# Patient Record
Sex: Male | Born: 1985 | Race: White | Hispanic: Yes | Smoking: Current every day smoker
Health system: Southern US, Community
[De-identification: ages and names within clinical notes are randomized; demographics above are authoritative.]

---

## 2010-01-21 ENCOUNTER — Emergency Department (HOSPITAL_COMMUNITY): Admission: EM | Admit: 2010-01-21 | Discharge: 2010-01-22 | Payer: Self-pay | Admitting: Emergency Medicine

## 2011-03-01 LAB — URINALYSIS, ROUTINE W REFLEX MICROSCOPIC
Bilirubin Urine: NEGATIVE
Glucose, UA: NEGATIVE mg/dL
Hgb urine dipstick: NEGATIVE
Ketones, ur: NEGATIVE mg/dL
pH: 6 (ref 5.0–8.0)

## 2011-03-01 LAB — URINE CULTURE

## 2016-02-27 ENCOUNTER — Other Ambulatory Visit: Payer: Self-pay

## 2016-02-27 ENCOUNTER — Encounter (HOSPITAL_COMMUNITY): Payer: Self-pay

## 2016-02-27 ENCOUNTER — Emergency Department (HOSPITAL_COMMUNITY): Payer: Self-pay

## 2016-02-27 ENCOUNTER — Emergency Department (HOSPITAL_COMMUNITY)
Admission: EM | Admit: 2016-02-27 | Discharge: 2016-02-27 | Disposition: A | Discharge status: Home / Self Care | Payer: Self-pay | Attending: Emergency Medicine | Admitting: Emergency Medicine

## 2016-02-27 DIAGNOSIS — M79605 Pain in left leg: Secondary | ICD-10-CM | POA: Insufficient documentation

## 2016-02-27 DIAGNOSIS — F17210 Nicotine dependence, cigarettes, uncomplicated: Secondary | ICD-10-CM | POA: Insufficient documentation

## 2016-02-27 DIAGNOSIS — I8392 Asymptomatic varicose veins of left lower extremity: Secondary | ICD-10-CM | POA: Insufficient documentation

## 2016-02-27 LAB — CBC WITH DIFFERENTIAL/PLATELET
BASOS ABS: 0.1 10*3/uL (ref 0.0–0.1)
BASOS PCT: 1 %
EOS ABS: 0.2 10*3/uL (ref 0.0–0.7)
Eosinophils Relative: 2 %
HCT: 46.2 % (ref 39.0–52.0)
HEMOGLOBIN: 16.1 g/dL (ref 13.0–17.0)
Lymphocytes Relative: 36 %
Lymphs Abs: 3.7 10*3/uL (ref 0.7–4.0)
MCH: 30 pg (ref 26.0–34.0)
MCHC: 34.8 g/dL (ref 30.0–36.0)
MCV: 86 fL (ref 78.0–100.0)
MONOS PCT: 5 %
Monocytes Absolute: 0.5 10*3/uL (ref 0.1–1.0)
NEUTROS PCT: 56 %
Neutro Abs: 5.8 10*3/uL (ref 1.7–7.7)
Platelets: 209 10*3/uL (ref 150–400)
RBC: 5.37 MIL/uL (ref 4.22–5.81)
RDW: 13 % (ref 11.5–15.5)
WBC: 10.3 10*3/uL (ref 4.0–10.5)

## 2016-02-27 LAB — BASIC METABOLIC PANEL
Anion gap: 11 (ref 5–15)
BUN: 11 mg/dL (ref 6–20)
CHLORIDE: 102 mmol/L (ref 101–111)
CO2: 28 mmol/L (ref 22–32)
CREATININE: 0.78 mg/dL (ref 0.61–1.24)
Calcium: 9.6 mg/dL (ref 8.9–10.3)
GFR calc non Af Amer: >60 mL/min (ref >60)
Glucose, Bld: 94 mg/dL (ref 65–99)
Potassium: 4 mmol/L (ref 3.5–5.1)
SODIUM: 141 mmol/L (ref 135–145)

## 2016-02-27 LAB — D-DIMER, QUANTITATIVE (NOT AT ARMC)

## 2016-02-27 LAB — CK: CK TOTAL: 78 U/L (ref 49–397)

## 2016-02-27 MED ORDER — IBUPROFEN 400 MG PO TABS
600.0000 mg | ORAL_TABLET | Freq: Once | ORAL | Status: AC
  Administered 2016-02-27: 600 mg via ORAL
  Filled 2016-02-27: qty 1

## 2016-02-27 NOTE — ED Notes (Signed)
Pt ambulates independently and with steady gait at time of discharge. Discharge instructions and follow up information reviewed with patient. No other questions or concerns voiced at this time.  

## 2016-02-27 NOTE — Discharge Instructions (Signed)
We were able to rule out blood clot in your legs with blood work. Please continue tylenol and motrin as needed for pain control. Keep legs elevated at rest to decrease swelling.   Return for worsening symptoms, including worsening pain, worsening swelling, fevers, difficulty breathing, chest pain, or any other symptoms concerning to you.  Venas varicosas (Varicose Veins) Las venas varicosas son venas que se han agrandado y tornado sinuosas. Suelen aparecer [employer] piernas, pero tambin pueden verse en otra parte del cuerpo. CAUSAS Esta afeccin se presenta como consecuencia del mal funcionamiento de las vlvulas de las venas, las cuales ayudan al retorno de la sangre desde las piernas hacia el corazn. Si estas vlvulas se daan, la sangre retrocede y regresa a las venas de la pierna, cerca de la superficie de la piel, lo que causa dilatacin venosa. FACTORES DE RIESGO Las personas que estn mucho tiempo paradas, las embarazadas o las personas con sobrepeso tienen ms probabilidades de tener venas varicosas. SIGNOS Y SNTOMAS  Venas abultadas, azuladas y de aspecto sinuoso que se observan con mayor frecuencia en las piernas.  Dolor o sensacin de Development worker, community las piernas. Estos sntomas pueden empeorar al final del da.  Hinchazn de las piernas.  Cambios en el color de la piel. DIAGNSTICO Generalmente, el mdico puede diagnosticar las venas varicosas al examinarle las piernas. Adems, puede recomendarle que se haga una ecografa de las venas de las piernas. TRATAMIENTO La mayora de las venas varicosas pueden tratarse en casa. Sin embargo, hay otros tratamientos a disposicin de [employer] tienen sntomas persistentes o desean mejorar la apariencia esttica de las venas varicosas. Estas opciones de tratamiento incluyen lo siguiente:  Escleroterapia. Se inyecta una solucin en la vena para anularla.  Tratamiento con lser. Se [place] un rayo lser para calentar la vena y  anularla.  Ablacin venosa por radiofrecuencia Se [place] una corriente elctrica que se produce mediante ondas de radio para anular la vena.  Flebectoma. Se extirpa quirrgicamente la vena a travs de pequeas incisiones que se hacen sobre la vena varicosa.  Ligadura venosa y varicectoma. La vena se extirpa quirrgicamente a travs de incisiones que se realizan sobre la vena varicosa despus de haberla anudado (ligado). INSTRUCCIONES PARA EL CUIDADO EN EL HOGAR  No permanezca sentado o de pie en una posicin durante mucho tiempo. No se siente con las piernas cruzadas. Descanse con las piernas Radiation protection practitioner.  Use medias de compresin como le haya indicado su mdico. Estas medias ayudan a evitar la formacin de cogulos sanguneos y a Building services engineer de las piernas.  No use otras prendas que le ajusten todo el contorno de las piernas, la pelvis o la cintura.  Camine todo lo posible para aumentar la circulacin de Risk manager.  A la noche, eleve el pie de la cama con bloques de 2pulgadas.  Si tiene un corte en la piel sobre la vena y la vena sangra, recustese con la pierna elevada y [place] presin en el lugar con un pao limpio, hasta que deje de Geophysicist/field seismologist. Luego aplique un apsito (vendaje) sobre el corte. Consulte al mdico si el sangrado contina. SOLICITE ATENCIN MDICA SI:  La piel alrededor del tobillo empieza a Lobbyist.  Siente dolor, hay enrojecimiento, sensibilidad o hinchazn dura en la pierna sobre una vena.  Est incmodo debido al dolor de la pierna.   Esta informacin no tiene Theme park manager el consejo del mdico. Asegrese de hacerle al mdico cualquier pregunta que tenga.  Document Released: 09/05/2005 Document Revised: 12/17/2014 Elsevier Interactive Patient Education 2016 [employer]  [place] Parkiruga de venas varicosas (Varicose Vein Surgery) La ciruga de venas varicosas es un procedimiento que se realiza para extirpar o reparar las venas varicosas.  Se trata de venas que estn hinchadas y retorcidas, y que son visibles debajo de la piel, especialmente en las piernas. Estas venas pueden verse azules y abultadas. Todas las venas tienen una vlvula que mantiene la sangre circulando en una nica direccin. Si estas vlvulas se debilitan o se daan, la sangre puede acumularse y causar venas varicosas. Pueden someterlo a [place]una ciruga de venas varicosas si estas venas estn causando sntomas o complicaciones, o si los Murphy Oilcambios en el estilo de vida no han sido de [place]ayuda. La ciruga puede Engineer, materialsaliviar el dolor y las Lighthouse Pointmolestias, y disminuir el riesgo de hemorragias y cogulos sanguneos. La ciruga tambin puede mejorar el aspecto de la zona afectada (aspecto esttico). Los diferentes tipos de [place]iruga de venas varicosas incluyen lo siguiente:  Tourist information centre managernyectar una sustancia qumica para ocluir una vena (escleroterapia).  Tratar Neomia Dearuna vena con energa lumnica (tratamiento con lser).  Usar rayos lser u ondas de sonido para ocluir una vena con calor (ablacin venosa por radiofrecuencia).  Extirpar quirrgicamente la vena a travs de una pequea incisin (flebectoma).  Extirpar quirrgicamente la vena a travs de incisiones despus de su ligadura (extirpacin venosa con ligadura). El mdico analizar el mtodo ms adecuado para usted en funcin de su afeccin. INFORME A SU MDICO:  Cualquier alergia que tenga.  Todos los Walt Disneymedicamentos que utiliza, incluidos vitaminas, hierbas, gotas oftlmicas, cremas y [address]medicamentos de [address] Northventa libre.  Problemas previos que usted o los Graybar Electricmiembros de su familia hayan tenido con el uso de anestsicos.  Enfermedades de la sangre que tenga.  Si tiene cirugas previas.  Enfermedades que tenga. RIESGOS Y COMPLICACIONES En general, se trata de un procedimiento seguro. Sin embargo, pueden ocurrir American International Groupalgunos problemas, que Baxter Internationalincluyen los siguientes:  Dao a las venas, los nervios o los tejidos cercanos.  Llagas.  Manchas  negras.  Irritacin de la piel.  Entumecimiento.  Formacin de cogulos.  Infeccin.  Formacin de cicatrices.  Hinchazn de las piernas.  Necesidad de someterse a ms tratamientos. ANTES DEL PROCEDIMIENTO   Consulte a su mdico acerca de estos temas:  Cambiar o suspender los medicamentos que toma habitualmente. Esto es muy importante si toma medicamentos para la diabetes o anticoagulantes.  Tomar medicamentos, como aspirina e ibuprofeno. Estos medicamentos pueden tener un efecto anticoagulante en la [place]angre. No tome estos medicamentos antes del procedimiento si el mdico le indica que no lo haga.  Pueden realizarle estudios antes de la ciruga de venas varicosas. Estos estudios pueden ser para lo siguiente:  Landscape architectDetectar la presencia de cogulos y Scientist, physiologicalcontrolar la circulacin sangunea mediante el uso de ondas de sonido (ecografa Doppler).  Observar cmo circula la sangre a travs de las venas al inyectar una sustancia de contraste que permite visualizar las venas en radiografas (angiografa). Este estudio se [place] Islandsutiliza en contadas ocasiones. PROCEDIMIENTO El procedimiento variar en funcin del tipo de Azerbaijanciruga de venas varicosas que se realice: Escleroterapia  A menudo, este procedimiento se [place]ausa para las venas pequeas a medianas.  Se inyectar en la vena una sustancia qumica (esclerosante) que irrita la pared venosa. Esto tendr como resultado la oclusin de la vena varicosa.  Pueden usarse esclerosantes en diferentes cantidades y dosis, en funcin del tamao y la ubicacin de la vena.  Tal vez deba someterse a ms de Pharmacist, communityun tratamiento.  Tratamiento con lser  Este procedimiento no requiere incisiones ni sustancias qumicas.  La energa lumnica del lser se apuntar a la vena.  El tratamiento con lser puede combinarse con Regulatory affairs officer.  Tal vez deba someterse a ms de Pharmacist, community. Ablacin de la vena por radiofrecuencia.  Le administrarn un medicamento para adormecer  la zona (anestesia local).  Se har un pequeo corte quirrgico (incisin) cerca de la vena varicosa.  Se introducir una sonda (catter) en la vena.  Se usar un lser u ondas de radio para ocluir la vena con calor a travs del extremo del catter. Flebectoma  Este procedimiento quirrgico se [place] para extirpar las venas que estn ms cerca de la piel.  Le administrarn un medicamento para adormecer la zona (anestesia local).  El cirujano realizar una pequea herida punzante cerca de la vena varicosa y luego usar un gancho diminuto para retirar la vena. Extirpacin venosa con ligadura  Este procedimiento quirrgico se [place] para tratar [employer] graves.  Para realizarlo, le administrarn un medicamento que lo har dormir (anestesia general).  El cirujano har una pequea incisin cerca de la vena de la ingle (vena safena).  Lo primero ser ligar la vena.  Luego, har unas cuantas incisiones ms a lo largo de la vena.  Se extirpar la vena.  La recuperacin total puede llevar de 1 a 4semanas. DESPUS DEL PROCEDIMIENTO   En funcin del tipo de procedimiento, tal vez pueda retomar sus actividades habituales el da posterior a su realizacin.  Quizs [employer] de compresin. Estas medias ayudan a Transport planner formacin de cogulos sanguneos y a Building services engineer de las piernas.   Esta informacin no tiene Theme park manager el consejo del mdico. Asegrese de hacerle al mdico cualquier pregunta que tenga.   Document Released: 09/16/2013 Document Revised: 12/17/2014 Elsevier Interactive Patient Education [employer]

## 2016-02-27 NOTE — ED Notes (Addendum)
Pt. Having lt. Leg pain approximately on his inner thigh area.  The area is painful to touch, red and swollen. Per Pt.  He feels he is not getting the proper circulation.   He denies any chest pain.  AT time when he is at work he feels winded and the leg pain increases.     Pt. Is deaf, Interrupter present.  No redness or swelling noted to the leg

## 2016-02-27 NOTE — ED Provider Notes (Signed)
CSN: [id number]     Arrival date & time 02/27/16  1351 History   First MD Initiated Contact with Patient 02/27/16 1910     Chief Complaint  Patient presents with  . Leg Pain     (Consider location/radiation/quality/duration/timing/severity/associated sxs/prior Treatment) HPI  History is obtained through a sign language interpreter 30 year old male who presents with left leg pain and swelling. This is been ongoing for the past several weeks, worse in the 4 days. Noted that the blood vessels in the inner aspect of the leg legs have become more distended in full, and he was concerned about blood clot. Says that he typically is on his feet for the majority of the day, and find that by sitting down and keeping her legs elevated the swelling is significantly improved. He has noted that with the swelling his leg becomes very sore, especially in his upper thigh. He did not have any trauma or injury to the leg. He denies any numbness or weakness. He has not had any overlying skin changes, fevers or chills. Denies any chest pain, but has noted that with activity over the past several days he has become intermittently a little short of breath. No chest pain, cough, orthopnea, PND.  History reviewed. No pertinent past medical history. History reviewed. No pertinent past surgical history. History reviewed. No pertinent family history. Social History  Substance Use Topics  . Smoking status: Current Every Day Smoker    Types: Cigarettes  . Smokeless tobacco: None  . Alcohol Use: Yes     Comment: occassional    Review of Systems 10/14 systems reviewed and are negative other than those stated in the HPI   Allergies  Review of patient's allergies indicates no known allergies.  Home Medications   Prior to Admission medications   Medication Sig Start Date End Date Taking? Authorizing Provider  ibuprofen (ADVIL,MOTRIN) 200 MG tablet Take 200 mg by mouth every 6 (six) hours as needed for moderate  pain.   Yes Historical Provider, MD   BP 113/87 mmHg  Pulse 70  Temp(Src) 98.5 F (36.9 C) (Oral)  Resp 15  Ht  (1.778 m)  Wt 188 lb 2 oz (85.333 kg)  BMI 26.99 kg/m2  SpO2 98% Physical Exam Physical Exam  Nursing note and vitals reviewed. Constitutional: Well developed, well nourished, non-toxic, and in no acute distress Head: Normocephalic and atraumatic.  Mouth/Throat: Oropharynx is clear and moist.  Neck: Normal range of motion. Neck supple.  Cardiovascular: Normal rate and regular rhythm.  +2 DP pulses bilaterally Pulmonary/Chest: Effort normal and breath sounds normal.  Abdominal: Soft. There is no tenderness. There is no rebound and no guarding.  Musculoskeletal: there is varicose veins along the medial aspect of the left lower extremity from the calf up to the mid thigh. No overlying skin changes Neurological: Alert, no facial droop, fluent speech, moves all extremities symmetrically Skin: Skin is warm and dry.  Psychiatric: Cooperative  ED Course  Procedures (including critical care time) Labs Review Labs Reviewed  CBC WITH DIFFERENTIAL/PLATELET  BASIC METABOLIC PANEL  CK  D-DIMER, QUANTITATIVE (NOT AT [facility])    Imaging Review Dg Chest 2 View  02/27/2016  CLINICAL DATA:  Dyspnea on exertion EXAM: CHEST  2 VIEW COMPARISON:  None. FINDINGS: The heart size and mediastinal contours are within normal limits. Both lungs are clear. The visualized skeletal structures are unremarkable. IMPRESSION: No active cardiopulmonary disease. Electronically Signed   By: Alcide Clever M.D.   On: 02/27/2016 20:47  I have personally reviewed and evaluated these images and lab results as part of my medical decision-making.   EKG Interpretation   Date/Time:  Monday February 27 2016 14:53:49 EDT Ventricular Rate:  83 PR Interval:  140 QRS Duration: 88 QT Interval:  350 QTC Calculation: 411 R Axis:   53 Text Interpretation:  Normal sinus rhythm Normal ECG No prior EKG, but   normal EKg Confirmed by Olanna Percifield MD, Annabelle HarmanANA ([id number]) on 02/27/2016 7:14:52 PM      MDM   Final diagnoses:  Varicose veins of left lower extremity  Left leg pain    30 year old male who presents with swelling and pain involving the left leg. Vital signs within normal limits on arrival. His LLE is neurovascularly in tact. No appreciable edema. There is area along medial aspect of the leg with swelling of the superficial veins c/f varicosities. No calf tenderness or calf swelling. Low risk for DVT and PE per history. D-dimer sent and negative. Ruled out for PE and DVT. No evidence of overlying skin change to suggest infection and no trauma. He is given referral for vein specialist. Will continue supportive care at home. Strict return and follow-up instructions reviewed. He expressed understanding of all discharge instructions and felt comfortable with the plan of care.  Work-up, results, plan of care, and discharge instructions all discussed via sign language interpreter.    Lavera Guiseana Duo Roch Quach, MD 02/27/16 2230

## 2017-02-08 IMAGING — DX DG CHEST 2V
2 series · 2 of 2 positions shown · non-contrast
Comparison: None.

CLINICAL DATA: Dyspnea on exertion

EXAM:
CHEST  2 VIEW

[w chest pa]
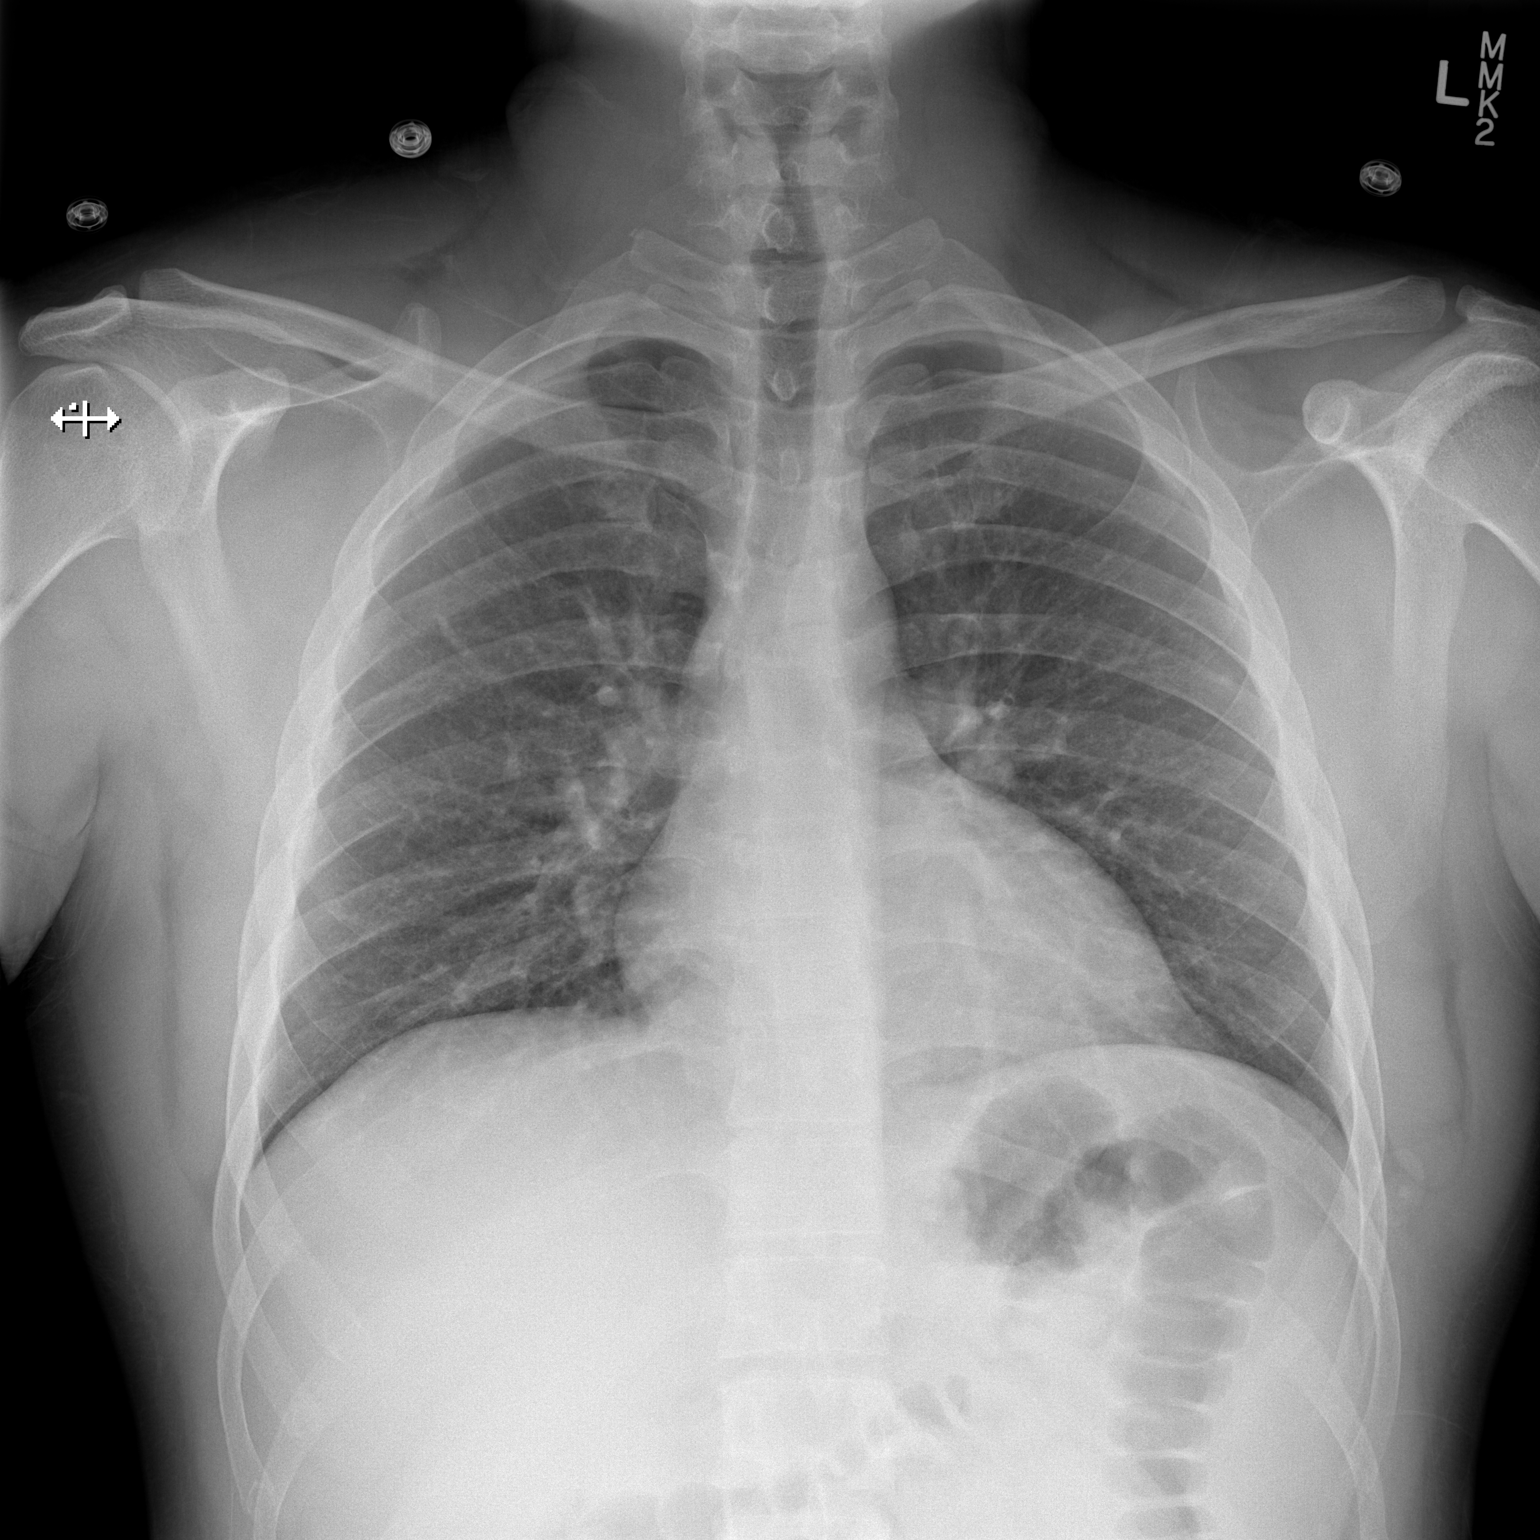

[w chest lat]
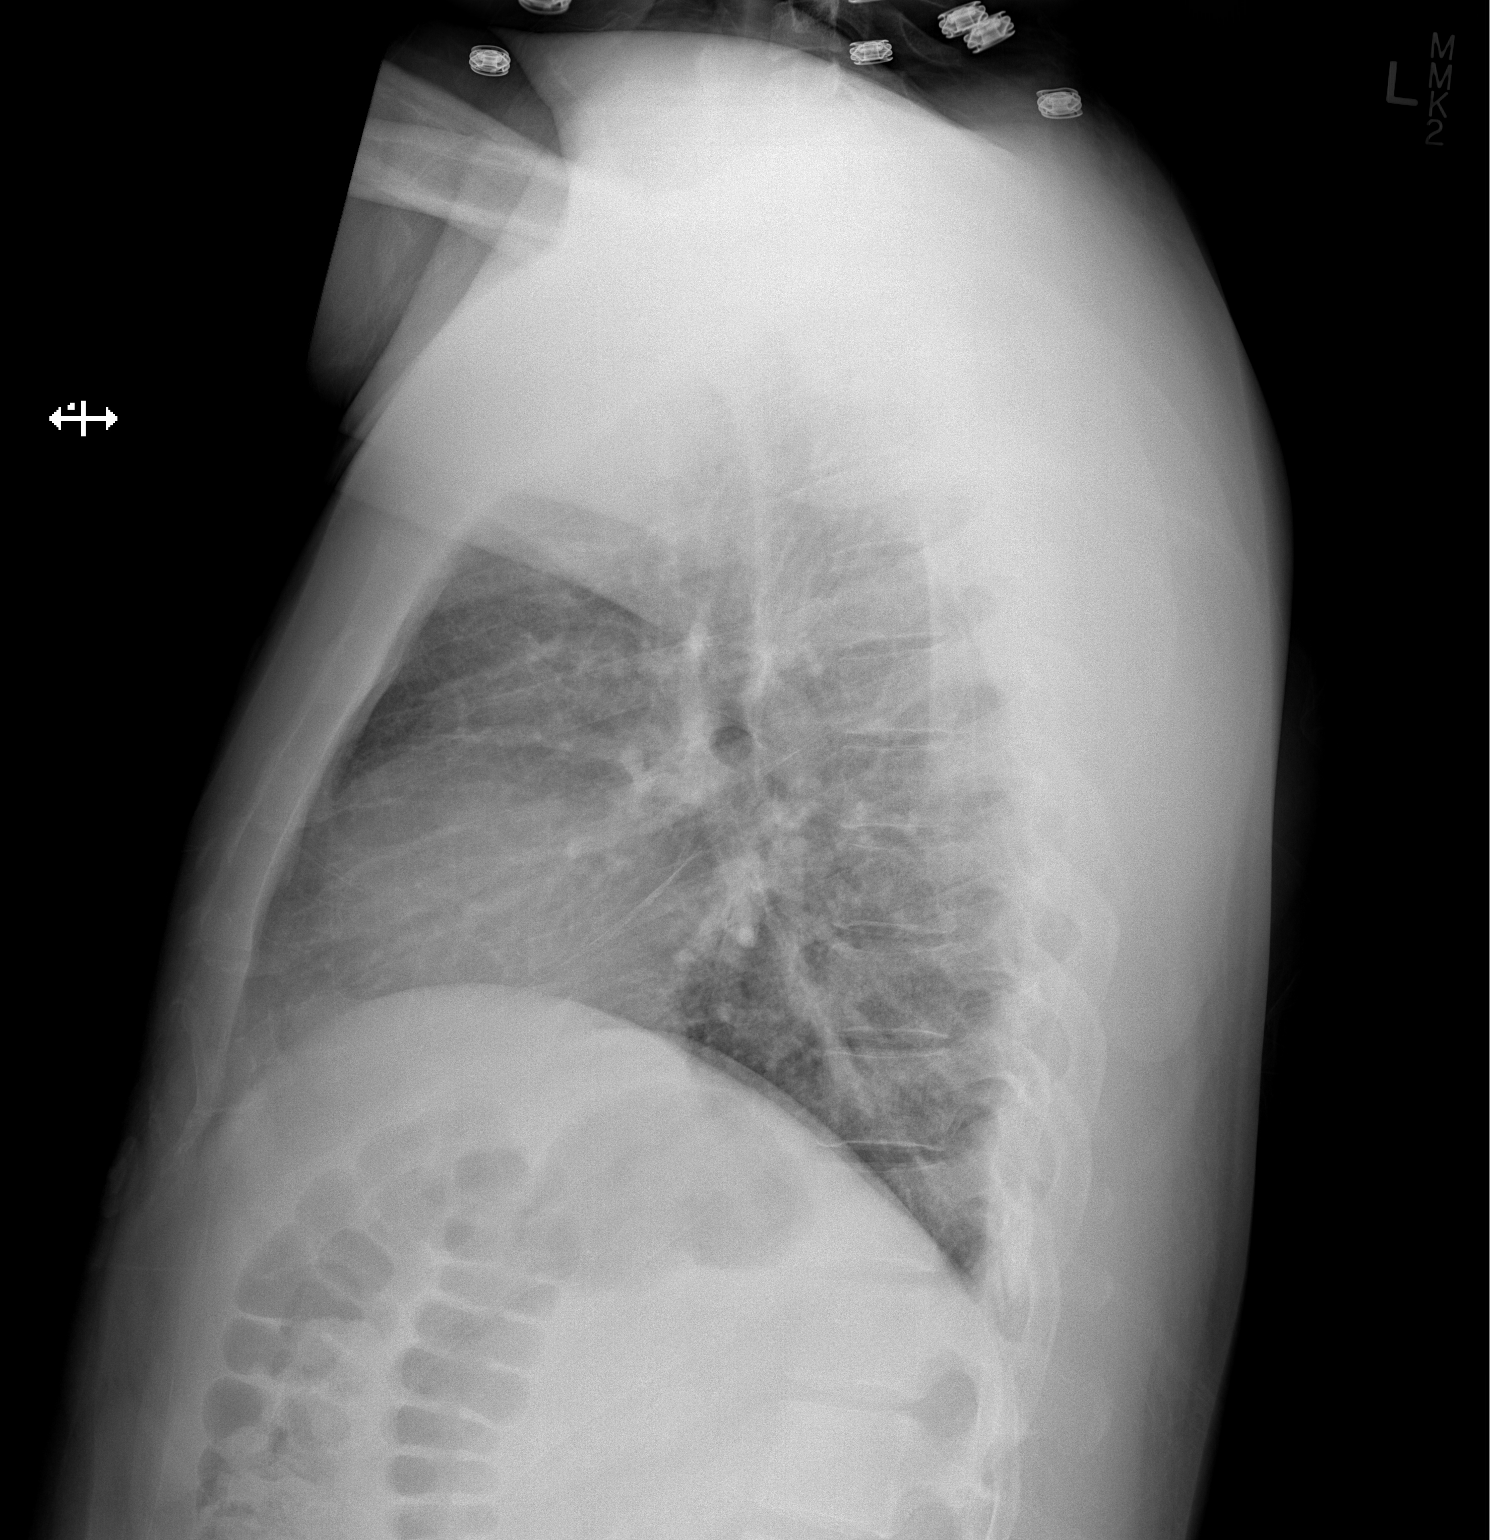

[2 of 2 positions shown; findings below may reference images not displayed]

FINDINGS: The heart size and mediastinal contours are within normal limits.
Both lungs are clear. The visualized skeletal structures are
unremarkable.
IMPRESSION: No active cardiopulmonary disease.

## 2023-03-23 ENCOUNTER — Other Ambulatory Visit: Payer: Self-pay

## 2023-03-23 ENCOUNTER — Emergency Department (HOSPITAL_COMMUNITY): Payer: Self-pay

## 2023-03-23 ENCOUNTER — Emergency Department (HOSPITAL_COMMUNITY)
Admission: EM | Admit: 2023-03-23 | Discharge: 2023-03-23 | Disposition: A | Discharge status: Home / Self Care | Payer: Self-pay | Attending: Emergency Medicine | Admitting: Emergency Medicine

## 2023-03-23 DIAGNOSIS — S61316A Laceration without foreign body of right little finger with damage to nail, initial encounter: Secondary | ICD-10-CM | POA: Insufficient documentation

## 2023-03-23 DIAGNOSIS — W312XXA Contact with powered woodworking and forming machines, initial encounter: Secondary | ICD-10-CM | POA: Insufficient documentation

## 2023-03-23 DIAGNOSIS — Z23 Encounter for immunization: Secondary | ICD-10-CM | POA: Insufficient documentation

## 2023-03-23 MED ORDER — TETANUS-DIPHTH-ACELL PERTUSSIS 5-2.5-18.5 LF-MCG/0.5 IM SUSY
0.5000 mL | PREFILLED_SYRINGE | Freq: Once | INTRAMUSCULAR | Status: AC
  Administered 2023-03-23: 0.5 mL via INTRAMUSCULAR
  Filled 2023-03-23: qty 0.5

## 2023-03-23 MED ORDER — LIDOCAINE HCL (PF) 1 % IJ SOLN
10.0000 mL | Freq: Once | INTRAMUSCULAR | Status: AC
  Administered 2023-03-23: 10 mL
  Filled 2023-03-23: qty 10

## 2023-03-23 MED ORDER — OXYCODONE-ACETAMINOPHEN 5-325 MG PO TABS
2.0000 | ORAL_TABLET | Freq: Once | ORAL | Status: AC
  Administered 2023-03-23: 2 via ORAL
  Filled 2023-03-23: qty 2

## 2023-03-23 MED ORDER — CEFAZOLIN SODIUM-DEXTROSE 2-4 GM/100ML-% IV SOLN
2.0000 g | Freq: Once | INTRAVENOUS | Status: AC
  Administered 2023-03-23: 2 g via INTRAVENOUS
  Filled 2023-03-23: qty 100

## 2023-03-23 MED ORDER — CEPHALEXIN 500 MG PO CAPS: 500.0000 mg | ORAL_CAPSULE | Freq: Three times a day (TID) | ORAL | 0 refills | Status: AC

## 2023-03-23 NOTE — ED Notes (Addendum)
EDP at [facility] for wound care/suturing. PT alert, NAD, calm, interactive.

## 2023-03-23 NOTE — ED Notes (Signed)
Finger splint placed on dressing to fifth finger/ secured by coban
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
## 2023-03-23 NOTE — ED Provider Notes (Signed)
[place] EMERGENCY DEPARTMENT AT [facility] Provider Note  Medical Decision Making   HPI: Phillip Summers is a 37 y.o. male with no perinent history who presents complaining of circular saw versus right small finger. Patient arrived via POV accompanied by coworker.  History provided by patient.  [employer] teleinterpreter facilitated this encounter, Aram Beecham [phone].  Patient reports that just prior to 2 PM on 4/13 he was cutting some wood on a circular saw at his job, he did not note that his finger was in the path of the blade, and he suffered a wound to his distal right little finger.  Reports that he had immediate pain and deformity, reports that he has had continued mild bleeding.  Reports moderate pain.  States that he does not feel that his finger is broken.  Is unaware of when his last tetanus shot was.  Denies injury to other parts of his body.  ROS: As per HPI. Please see MAR for complete past medical history, surgical history, and social history.   Physical exam is pertinent for laceration of the distal tip of the right little finger on the ulnar aspect, intact sensation to light touch and present capillary refill.   The differential includes but is not limited to laceration, nailbed disruption, fracture, dislocation, open fracture.  Additional history obtained from: None External records from outside source obtained and reviewed including: None  ED provider interpretation of ECG: Not indicated  ED provider interpretation of radiology/imaging: Personally reviewed plain film of the hand, I do not appreciate displaced fracture, dislocation, or radiopaque foreign body  Labs ordered were interpreted by myself as well as my attending and were incorporated into the medical decision making process for this patient.  ED provider interpretation of labs: not indicated  Interventions: ancef, lidocaine, percocet, tdap   See the EMR for full details regarding lab and imaging  results.  Patient overall well-appearing on exam.  Does not have any other injuries outside of little finger per patient report and prior physical exam.  X-ray does not demonstrate any radiopaque foreign body.  There is a possible avulsion fracture anteriorly.  Patient does have some weakness in extension.  Consulted hand surgery, spoke with Dr. Kerry Fort.  He recommended antibiotics, loose closure, recommends against removal of nail, and follow-up in clinic.  Wound repaired as per procedure note below, anesthetized with digital block.  Administered Ancef, discharged with Keflex, recommendation for Tylenol and ibuprofen for pain control.  Referral placed for hand surgery follow-up.  Tdap Updated while in the ED.  Notably, patient did have dusky tissue at the wound edges, discussed with patient that this could represent devitalized tissue and that avulsed tissue could die.  Discussed with interpreter, patient expressed understanding.  Consults: Hand surgery  Disposition: DISCHARGE: I believe that the patient is safe for discharge home with outpatient follow-up. Patient was informed of all pertinent physical exam, laboratory, and imaging findings. Patient's suspected etiology of their symptom presentation was discussed with the patient and all questions were answered. We discussed following up with PCP and hand surgery. I provided thorough ED return precautions. The patient feels safe and comfortable with this plan.  The plan for this patient was discussed with Dr. Silverio Lay, who voiced agreement and who oversaw evaluation and treatment of this patient.  Clinical Impression:  1. Laceration of right little finger without foreign body with damage to nail, initial encounter    Discharge  Therapies: These medications and interventions were provided for the patient while  in the ED. Medications  Tdap (BOOSTRIX) injection 0.5 mL (0.5 mLs Intramuscular Given 03/23/23 1625)  oxyCODONE-acetaminophen  (PERCOCET/ROXICET) 5-325 MG per tablet 2 tablet (2 tablets Oral Given 03/23/23 1627)  ceFAZolin (ANCEF) IVPB 2g/100 mL premix (0 g Intravenous Stopped 03/23/23 1702)  lidocaine (PF) (XYLOCAINE) 1 % injection 10 mL (10 mLs Infiltration Given by Other 03/23/23 1650)    MDM generated using voice dictation software and may contain dictation errors.  Please contact me for any clarification or with any questions.  Clinical Complexity A medically appropriate history, review of systems, and physical exam was performed.  Collateral history obtained from: None I personally reviewed the labs, EKG, imaging as discussed above. Patient's presentation is most consistent with acute complicated illness / injury requiring diagnostic workup Considered and ruled out life and body threatening conditions  Treatment: Outpatient referral Medications: Prescription Discussed patient's care with providers from the following different specialties: Hand surgery  Physical Exam   ED Triage Vitals  Enc Vitals Group     BP 03/23/23 1506 122/74     Pulse Rate 03/23/23 1506 86     Resp 03/23/23 1506 14     Temp 03/23/23 1506 98.3 F (36.8 C)     Temp src --      SpO2 03/23/23 1506 100 %     Weight --      Height --      Head Circumference --      Peak Flow --      Pain Score 03/23/23 1507 3     Pain Loc --      Pain Edu? --      Excl. in GC? --      Physical Exam Vitals and nursing note reviewed.  Constitutional:      General: He is not in acute distress.    Appearance: He is well-developed.  HENT:     Head: Normocephalic and atraumatic.  Eyes:     Extraocular Movements: Extraocular movements intact.     Conjunctiva/sclera: Conjunctivae normal.  Cardiovascular:     Rate and Rhythm: Normal rate and regular rhythm.     Heart sounds: No murmur heard. Pulmonary:     Effort: Pulmonary effort is normal. No respiratory distress.  Musculoskeletal:        General: Signs of injury present. No swelling.      Cervical back: Neck supple.     Comments: Proximately 3 cm laceration to the ulnar aspect of the right little finger, extending from the dorsum of the finger at the edge of the nail, through around the tip of the finger onto the volar pad of the distal finger.  Intact sensation and cap refill of tissue.  Skin:    General: Skin is warm and dry.     Capillary Refill: Capillary refill takes less than 2 seconds.  Neurological:     Mental Status: He is alert.  Psychiatric:        Mood and Affect: Mood normal.          Procedure Note  .Marland KitchenLaceration Repair  Date/Time: 03/23/2023 8:53 PM  Performed by: Curley Spice, MD Authorized by: Charlynne Pander, MD   Consent:    Consent obtained:  Verbal   Consent given by:  Patient   Risks, benefits, and alternatives were discussed: yes     Risks discussed:  Infection, nerve damage, need for additional repair, pain, poor cosmetic result, poor wound healing, retained foreign body, tendon damage and vascular damage Universal protocol:  Imaging studies available: yes     Immediately prior to procedure, a time out was called: yes     Patient identity confirmed:  Verbally with patient Anesthesia:    Anesthesia method:  Nerve block   Block location:  Right little finger digital block   Block needle gauge:  27 G   Block anesthetic:  Lidocaine 1% w/o epi   Block injection procedure:  Anatomic landmarks identified, introduced needle, negative aspiration for blood and incremental injection   Block outcome:  Anesthesia achieved Laceration details:    Location:  Finger   Finger location:  R ring finger   Length (cm):  3   Depth (mm):  5 Pre-procedure details:    Preparation:  Imaging obtained to evaluate for foreign bodies Exploration:    Limited defect created (wound extended): no     Hemostasis achieved with:  Direct pressure   Imaging obtained: x-ray     Imaging outcome: foreign body not noted     Wound exploration: wound explored through  full range of motion and entire depth of wound visualized     Wound extent: underlying fracture     Contaminated: no   Treatment:    Area cleansed with:  Saline   Amount of cleaning:  Extensive   Irrigation solution:  Sterile saline   Irrigation volume:  1L   Irrigation method:  Syringe   Visualized foreign bodies/material removed: no     Debridement:  None Skin repair:    Repair method:  Sutures   Suture size:  4-0   Suture material:  Prolene Approximation:    Approximation:  Loose Repair type:    Repair type:  Simple Post-procedure details:    Dressing:  Tube gauze, antibiotic ointment and splint for protection   Procedure completion:  Tolerated well, no immediate complications      DG Hand Complete Right  Final Result      Julianne Rice, MD Emergency Medicine, PGY-2   Curley Spice, MD 03/23/23 2056    Charlynne Pander, MD 03/23/23 [phone]

## 2023-03-23 NOTE — ED Triage Notes (Signed)
Patient is deaf and arrives from [facility] for further eval of R fifth digit injury while using a saw. Unknown last tetanus.
